# Patient Record
Sex: Male | Born: 1980 | Race: Black or African American | Hispanic: No | Marital: Single | Smoking: Current every day smoker
Health system: Southern US, Community
[De-identification: ages and names within clinical notes are randomized; demographics above are authoritative.]

## PROBLEM LIST (undated history)

## (undated) HISTORY — PX: TIBIA FRACTURE SURGERY: SHX806

---

## 2001-07-14 ENCOUNTER — Emergency Department (HOSPITAL_COMMUNITY): Admission: EM | Admit: 2001-07-14 | Discharge: 2001-07-14 | Payer: Self-pay | Admitting: Emergency Medicine

## 2001-10-29 ENCOUNTER — Emergency Department (HOSPITAL_COMMUNITY): Admission: EM | Admit: 2001-10-29 | Discharge: 2001-10-29 | Payer: Self-pay | Admitting: Emergency Medicine

## 2002-10-20 ENCOUNTER — Emergency Department (HOSPITAL_COMMUNITY): Admission: EM | Admit: 2002-10-20 | Discharge: 2002-10-20 | Payer: Self-pay | Admitting: Emergency Medicine

## 2002-10-23 ENCOUNTER — Emergency Department (HOSPITAL_COMMUNITY): Admission: EM | Admit: 2002-10-23 | Discharge: 2002-10-23 | Payer: Self-pay | Admitting: Emergency Medicine

## 2002-10-29 ENCOUNTER — Emergency Department (HOSPITAL_COMMUNITY): Admission: EM | Admit: 2002-10-29 | Discharge: 2002-10-29 | Payer: Self-pay | Admitting: Emergency Medicine

## 2004-12-12 ENCOUNTER — Emergency Department (HOSPITAL_COMMUNITY): Admission: EM | Admit: 2004-12-12 | Discharge: 2004-12-12 | Payer: Self-pay | Admitting: Emergency Medicine

## 2005-07-24 ENCOUNTER — Observation Stay (HOSPITAL_COMMUNITY): Admission: EM | Admit: 2005-07-24 | Discharge: 2005-07-25 | Payer: Self-pay | Admitting: Emergency Medicine

## 2005-08-28 ENCOUNTER — Emergency Department (HOSPITAL_COMMUNITY): Admission: EM | Admit: 2005-08-28 | Discharge: 2005-08-28 | Payer: Self-pay | Admitting: Emergency Medicine

## 2005-09-18 ENCOUNTER — Emergency Department (HOSPITAL_COMMUNITY): Admission: EM | Admit: 2005-09-18 | Discharge: 2005-09-18 | Payer: Self-pay | Admitting: Emergency Medicine

## 2007-08-19 ENCOUNTER — Emergency Department (HOSPITAL_COMMUNITY): Admission: EM | Admit: 2007-08-19 | Discharge: 2007-08-19 | Payer: Self-pay | Admitting: Emergency Medicine

## 2008-01-18 ENCOUNTER — Emergency Department (HOSPITAL_COMMUNITY): Admission: EM | Admit: 2008-01-18 | Discharge: 2008-01-18 | Payer: Self-pay | Admitting: Emergency Medicine

## 2009-02-15 ENCOUNTER — Emergency Department (HOSPITAL_COMMUNITY): Admission: EM | Admit: 2009-02-15 | Discharge: 2009-02-15 | Payer: Self-pay | Admitting: Emergency Medicine

## 2009-06-19 ENCOUNTER — Emergency Department (HOSPITAL_COMMUNITY): Admission: EM | Admit: 2009-06-19 | Discharge: 2009-06-19 | Payer: Self-pay | Admitting: Emergency Medicine
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
## 2015-10-11 ENCOUNTER — Emergency Department (HOSPITAL_COMMUNITY)
Admission: EM | Admit: 2015-10-11 | Discharge: 2015-10-12 | Disposition: A | Discharge status: Home / Self Care | Payer: Self-pay | Attending: Emergency Medicine | Admitting: Emergency Medicine

## 2015-10-11 ENCOUNTER — Encounter (HOSPITAL_COMMUNITY): Payer: Self-pay | Admitting: *Deleted

## 2015-10-11 ENCOUNTER — Emergency Department (HOSPITAL_COMMUNITY): Payer: Self-pay

## 2015-10-11 DIAGNOSIS — F17210 Nicotine dependence, cigarettes, uncomplicated: Secondary | ICD-10-CM | POA: Insufficient documentation

## 2015-10-11 DIAGNOSIS — Z4889 Encounter for other specified surgical aftercare: Secondary | ICD-10-CM

## 2015-10-11 DIAGNOSIS — Z4801 Encounter for change or removal of surgical wound dressing: Secondary | ICD-10-CM | POA: Insufficient documentation

## 2015-10-11 DIAGNOSIS — Z4802 Encounter for removal of sutures: Secondary | ICD-10-CM | POA: Insufficient documentation

## 2015-10-11 NOTE — ED Notes (Signed)
Pt states that he had surgery in [place] in 2/26; pt states that he broke the tibia and ankle; pt states that he has not followed up with anyone since the surgery; pt states that he needs to have the sutures removed and the wound checked; left leg is in a posterior splint

## 2015-10-12 NOTE — ED Notes (Signed)
MD at bedside. 

## 2015-10-12 NOTE — Discharge Instructions (Signed)
Follow up with Orthopedic doctor for further evaluation. Continue to not put any weight on the leg.  If you have severe pain, fevers, chills, numbness in the legs- come to the ER immediately.   Suture Removal, Care After Refer to this sheet in the next few weeks. These instructions provide you with information on caring for yourself after your procedure. Your health care provider may also give you more specific instructions. Your treatment has been planned according to current medical practices, but problems sometimes occur. Call your health care provider if you have any problems or questions after your procedure. WHAT TO EXPECT AFTER THE PROCEDURE After your stitches (sutures) are removed, it is typical to have the following:  Some discomfort and swelling in the wound area.  Slight redness in the area. HOME CARE INSTRUCTIONS   If you have skin adhesive strips over the wound area, do not take the strips off. They will fall off on their own in a few days. If the strips remain in place after 14 days, you may remove them.  Change any bandages (dressings) at least once a day or as directed by your health care provider. If the bandage sticks, soak it off with warm, soapy water.  Apply cream or ointment only as directed by your health care provider. If using cream or ointment, wash the area with soap and water 2 times a day to remove all the cream or ointment. Rinse off the soap and pat the area dry with a clean towel.  Keep the wound area dry and clean. If the bandage becomes wet or dirty, or if it develops a bad smell, change it as soon as possible.  Continue to protect the wound from injury.  Use sunscreen when out in the sun. New scars become sunburned easily. SEEK MEDICAL CARE IF:  You have increasing redness, swelling, or pain in the wound.  You see pus coming from the wound.  You have a fever.  You notice a bad smell coming from the wound or dressing.  Your wound breaks open  (edges not staying together).   This information is not intended to replace advice given to you by your health care provider. Make sure you discuss any questions you have with your health care provider.   Document Released: 04/04/2001 Document Revised: 04/30/2013 Document Reviewed: 02/19/2013 Elsevier Interactive Patient Education [employer]2016 Elsevier Inc.
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
## 2015-10-12 NOTE — ED Notes (Signed)
Ortho Tech at bedside.  

## 2015-10-12 NOTE — Progress Notes (Signed)
Orthopedic Tech Progress Note Patient Details:  Philip Watson 01/25/1981 [id number]  Ortho Devices Type of Ortho Device: Post (short leg) splint, Stirrup splint Ortho Device/Splint Location: lle Ortho Device/Splint Interventions: Ordered, Application As per dr verbal order splint was applied all the way to 2 fingers width from the bend of the knee and same length on stirrups.   Trinna PostMartinez, Khloey Chern J 10/12/2015, 3:04 AM

## 2016-09-29 ENCOUNTER — Emergency Department (HOSPITAL_COMMUNITY): Payer: Self-pay

## 2016-09-29 ENCOUNTER — Encounter (HOSPITAL_COMMUNITY): Payer: Self-pay | Admitting: Emergency Medicine

## 2016-09-29 ENCOUNTER — Emergency Department (HOSPITAL_COMMUNITY)
Admission: EM | Admit: 2016-09-29 | Discharge: 2016-09-29 | Disposition: A | Discharge status: Home / Self Care | Payer: Self-pay | Attending: Emergency Medicine | Admitting: Emergency Medicine

## 2016-09-29 DIAGNOSIS — R1031 Right lower quadrant pain: Secondary | ICD-10-CM | POA: Insufficient documentation

## 2016-09-29 DIAGNOSIS — F17210 Nicotine dependence, cigarettes, uncomplicated: Secondary | ICD-10-CM | POA: Insufficient documentation

## 2016-09-29 DIAGNOSIS — Z7982 Long term (current) use of aspirin: Secondary | ICD-10-CM | POA: Insufficient documentation

## 2016-09-29 DIAGNOSIS — Z79899 Other long term (current) drug therapy: Secondary | ICD-10-CM | POA: Insufficient documentation

## 2016-09-29 LAB — URINALYSIS, ROUTINE W REFLEX MICROSCOPIC
BILIRUBIN URINE: NEGATIVE
Bacteria, UA: NONE SEEN
Glucose, UA: NEGATIVE mg/dL
KETONES UR: NEGATIVE mg/dL
LEUKOCYTES UA: NEGATIVE
Nitrite: NEGATIVE
PH: 5 (ref 5.0–8.0)
PROTEIN: NEGATIVE mg/dL
SQUAMOUS EPITHELIAL / LPF: NONE SEEN
Specific Gravity, Urine: 1.015 (ref 1.005–1.030)

## 2016-09-29 LAB — CBC WITH DIFFERENTIAL/PLATELET
Basophils Absolute: 0 10*3/uL (ref 0.0–0.1)
Basophils Relative: 0 %
Eosinophils Absolute: 0.2 10*3/uL (ref 0.0–0.7)
Eosinophils Relative: 5 %
HCT: 42.6 % (ref 39.0–52.0)
Hemoglobin: 14 g/dL (ref 13.0–17.0)
Lymphocytes Relative: 53 %
Lymphs Abs: 2 10*3/uL (ref 0.7–4.0)
MCH: 29.7 pg (ref 26.0–34.0)
MCHC: 32.9 g/dL (ref 30.0–36.0)
MCV: 90.3 fL (ref 78.0–100.0)
Monocytes Absolute: 0.1 10*3/uL (ref 0.1–1.0)
Monocytes Relative: 1 %
Neutro Abs: 1.6 10*3/uL — ABNORMAL LOW (ref 1.7–7.7)
Neutrophils Relative %: 41 %
Platelets: 219 10*3/uL (ref 150–400)
RBC: 4.72 MIL/uL (ref 4.22–5.81)
RDW: 15.2 % (ref 11.5–15.5)
WBC: 3.8 10*3/uL — ABNORMAL LOW (ref 4.0–10.5)

## 2016-09-29 LAB — COMPREHENSIVE METABOLIC PANEL
ALBUMIN: 4.2 g/dL (ref 3.5–5.0)
ALT: 25 U/L (ref 17–63)
ANION GAP: 11 (ref 5–15)
AST: 33 U/L (ref 15–41)
Alkaline Phosphatase: 80 U/L (ref 38–126)
BUN: 5 mg/dL — AB (ref 6–20)
CHLORIDE: 105 mmol/L (ref 101–111)
CO2: 22 mmol/L (ref 22–32)
Calcium: 9.3 mg/dL (ref 8.9–10.3)
Creatinine, Ser: 0.73 mg/dL (ref 0.61–1.24)
GFR calc Af Amer: >60 mL/min (ref >60)
GFR calc non Af Amer: >60 mL/min (ref >60)
GLUCOSE: 87 mg/dL (ref 65–99)
POTASSIUM: 3.3 mmol/L — AB (ref 3.5–5.1)
SODIUM: 138 mmol/L (ref 135–145)
Total Bilirubin: 0.6 mg/dL (ref 0.3–1.2)
Total Protein: 7.7 g/dL (ref 6.5–8.1)

## 2016-09-29 LAB — LIPASE, BLOOD: LIPASE: 43 U/L (ref 11–51)

## 2016-09-29 LAB — POC OCCULT BLOOD, ED: Fecal Occult Bld: NEGATIVE

## 2016-09-29 MED ORDER — HYDROCODONE-ACETAMINOPHEN 5-325 MG PO TABS: 1.0000 | ORAL_TABLET | Freq: Four times a day (QID) | ORAL | 0 refills | Status: AC | PRN

## 2016-09-29 MED ORDER — IOPAMIDOL (ISOVUE-300) INJECTION 61%
INTRAVENOUS | Status: AC
  Administered 2016-09-29: 100 mL
  Filled 2016-09-29: qty 100

## 2016-09-29 MED ORDER — SODIUM CHLORIDE 0.9 % IV BOLUS (SEPSIS)
1000.0000 mL | Freq: Once | INTRAVENOUS | Status: AC
  Administered 2016-09-29: 1000 mL via INTRAVENOUS

## 2016-09-29 MED ORDER — FENTANYL CITRATE (PF) 100 MCG/2ML IJ SOLN
100.0000 ug | Freq: Once | INTRAMUSCULAR | Status: AC
  Administered 2016-09-29: 100 ug via INTRAVENOUS
  Filled 2016-09-29: qty 2

## 2016-09-29 MED ORDER — ONDANSETRON 4 MG PO TBDP
4.0000 mg | ORAL_TABLET | Freq: Once | ORAL | Status: AC | PRN
  Administered 2016-09-29: 4 mg via ORAL

## 2016-09-29 MED ORDER — ONDANSETRON 4 MG PO TBDP
ORAL_TABLET | ORAL | Status: AC
  Filled 2016-09-29: qty 1

## 2016-09-29 NOTE — ED Provider Notes (Signed)
[facility]EMERGENCY DEPT Provider Note   CSN: [id number] Arrival date & time: 09/29/16  0104  By signing my name below, I, Cynda Acres, attest that this documentation has been prepared under the direction and in the presence of [employer] Electronically Signed: Cynda Acres, Scribe. 09/29/16. 1:53 AM.  History   Chief Complaint Chief Complaint  Patient presents with  . Abdominal Pain  . Nausea  . Back Pain    HPI Comments: Philip Watson REINDERS is a 36 y.o. male with no pertinent medical history, who presents to the Emergency Department complaining ofright-sided abdominal pain that began yesterday. Patient states he was seen about a month and half ago at another facility and had laboratory testing and ultrasound performed with no significant abnormality.  Patient states that movement and palpation make the pain worse.  He states he did not take any medications prior to arrival.The patient denies chest pain, shortness of breath, headache,blurred vision, neck pain, fever, cough, weakness, numbness, dizziness, anorexia, edema, nausea, vomiting, diarrhea, rash, back pain, dysuria, hematemesis, bloody stool, near syncope, or syncope.  Patient reports associated nausea,   Patient denies any vomiting,   The history is provided by the patient. No language interpreter was used.    History reviewed. No pertinent past medical history.  There are no active problems to display for this patient.   Past Surgical History:  Procedure Laterality Date  . TIBIA FRACTURE SURGERY         Home Medications    Prior to Admission medications   Medication Sig Start Date End Date Taking? Authorizing Provider  aspirin 325 MG tablet Take 325 mg by mouth every 12 (twelve) hours as needed for mild pain, moderate pain or headache.    Historical Provider, MD  HYDROcodone-acetaminophen (NORCO/VICODIN) 5-325 MG tablet Take 1 tablet by mouth every 6 (six) hours as needed for moderate pain or severe pain.     Historical Provider, MD    Family History History reviewed. No pertinent family history.  Social History Social History  Substance Use Topics  . Smoking status: Current Every Day Smoker    Packs/day: 0.50    Types: Cigarettes  . Smokeless tobacco: Never Used  . Alcohol use Yes     Comment: socially     Allergies   Patient has no known allergies.   Review of Systems Review of Systems  Gastrointestinal: Positive for abdominal pain and nausea.  All other systems negative except as documented in the HPI. All pertinent positives and negatives as reviewed in the HPI.  Physical Exam Updated Vital Signs BP 141/97 (BP Location: Left Arm)   Pulse 72   Temp 98.1 F (36.7 C) (Oral)   Resp 18   SpO2 97%   Physical Exam  Constitutional: He is oriented to person, place, and time. He appears well-developed and well-nourished. No distress.  HENT:  Head: Normocephalic and atraumatic.  Mouth/Throat: Oropharynx is clear and moist.  Eyes: Pupils are equal, round, and reactive to light.  Neck: Normal range of motion. Neck supple.  Cardiovascular: Normal rate, regular rhythm and normal heart sounds.  Exam reveals no gallop and no friction rub.   No murmur heard. Pulmonary/Chest: Effort normal and breath sounds normal. No respiratory distress. He has no wheezes.  Abdominal: Soft. Bowel sounds are normal. He exhibits no distension and no mass. There is tenderness. There is no rebound and no guarding.    Neurological: He is alert and oriented to person, place, and time. He exhibits normal muscle  tone. Coordination normal.  Skin: Skin is warm and dry. Capillary refill takes less than 2 seconds. No rash noted. No erythema.  Psychiatric: He has a normal mood and affect. His behavior is normal.  Nursing note and vitals reviewed.    ED Treatments / Results  DIAGNOSTIC STUDIES: Oxygen Saturation is 97% on RA, normal by my interpretation.    COORDINATION OF CARE: 1:57 AM Discussed  treatment plan with pt at bedside and pt agreed to plan.  Labs (all labs ordered are listed, but only abnormal results are displayed) Labs Reviewed  LIPASE, BLOOD  COMPREHENSIVE METABOLIC PANEL  CBC  URINALYSIS, ROUTINE W REFLEX MICROSCOPIC    EKG  EKG Interpretation None       Radiology No results found.  Procedures Procedures (including critical care time)  Medications Ordered in ED Medications  ondansetron (ZOFRAN-ODT) 4 MG disintegrating tablet (not administered)  ondansetron (ZOFRAN-ODT) disintegrating tablet 4 mg (4 mg Oral Given 09/29/16 0147)     Initial Impression / Assessment and Plan / ED Course  I have reviewed the triage vital signs and the nursing notes.  Pertinent labs & imaging results that were available during my care of the patient were reviewed by me and considered in my medical decision making (see chart for details).     Patient has a negative CT scan and laboratory testing, and advised him that we will need to have him follow-up with GI for further evaluation.  Patient agrees the plan and all questions were answered this point, no specific identifiable cause for the abdominal pain.  Patient is a fairly heavy drinker.   Final Clinical Impressions(s) / ED Diagnoses   Final diagnoses:  None    New Prescriptions New Prescriptions   No medications on file   I personally performed the services described in this documentation, which was scribed in my presence. The recorded information has been reviewed and is accurate.     Charlestine NightChristopher Uchechi [place], PA-C 09/29/16 0533    Gilda Creasehristopher J Pollina, MD 09/29/16 [phone]

## 2016-09-29 NOTE — Discharge Instructions (Signed)
Return here as needed.  Follow-up with the, Dr. provided °

## 2016-09-29 NOTE — ED Notes (Signed)
Sent add on label to main lab. 

## 2016-09-29 NOTE — ED Triage Notes (Signed)
Pt presents with R sided abd pain that began today with walking; pt states the pain extends from RUQ to RLQ and around to the lower back; pt also reporting nausea without vomiting

## 2018-11-04 IMAGING — CT CT ABD-PELV W/ CM
2 of 4 series · 16 of 46 positions shown, 18 images · IV contrast (iopamidol)
Comparison: None.

CLINICAL DATA: Right-sided abdominal pain radiating to the back,
onset yesterday. Nausea.

EXAM:
CT ABDOMEN AND PELVIS WITH CONTRAST
TECHNIQUE: Multidetector CT imaging of the abdomen and pelvis was performed
using the standard protocol following bolus administration of
intravenous contrast.
CONTRAST:  100mL [id number] IOPAMIDOL (99DXMW-A11) INJECTION 61%

[Series 3: a/p w/ 5mm · axial · 0.59mm/px · z∈[+696,+1136]mm · 13 of 96 slices shown, 15 images]
[im 4/96  soft-tissue]
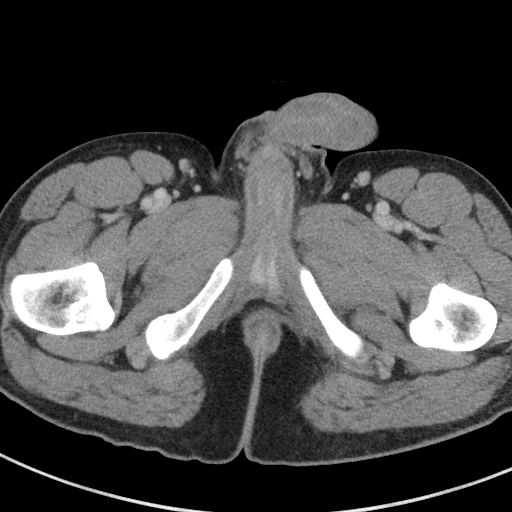
[im 4/96  bone]
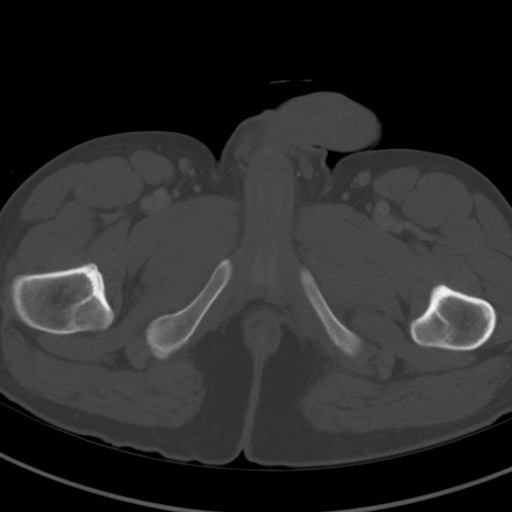
[im 12/96  soft-tissue]
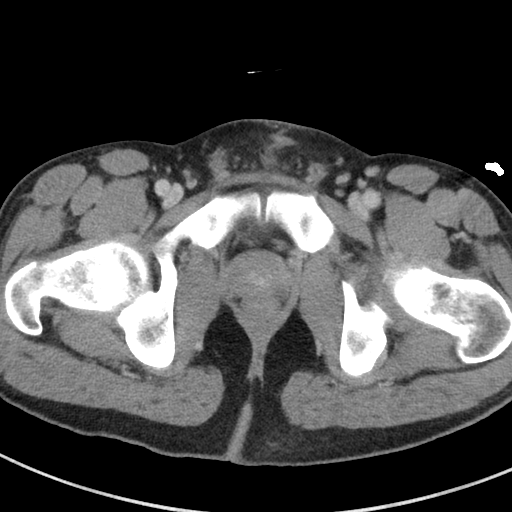
[im 20/96  soft-tissue]
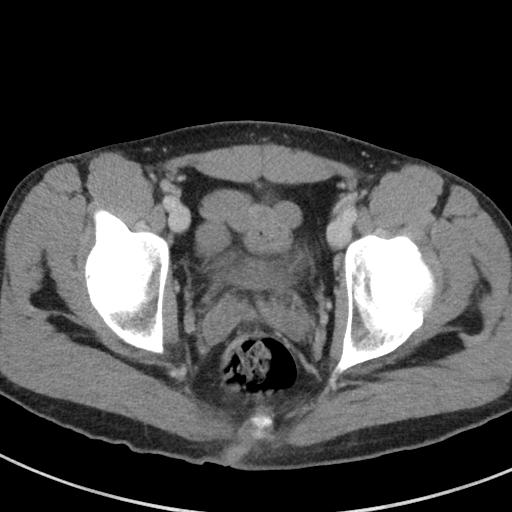
[im 27/96  soft-tissue]
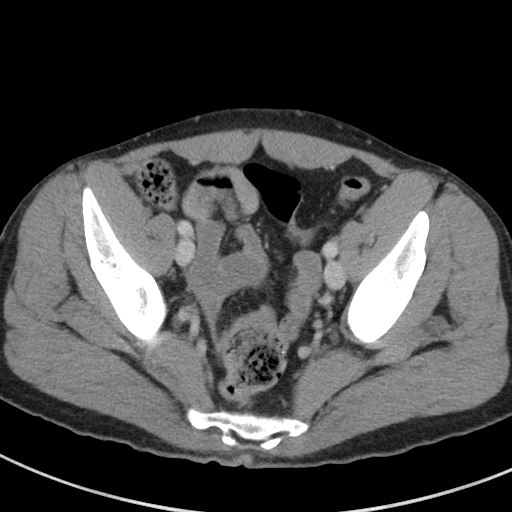
[im 35/96  soft-tissue]
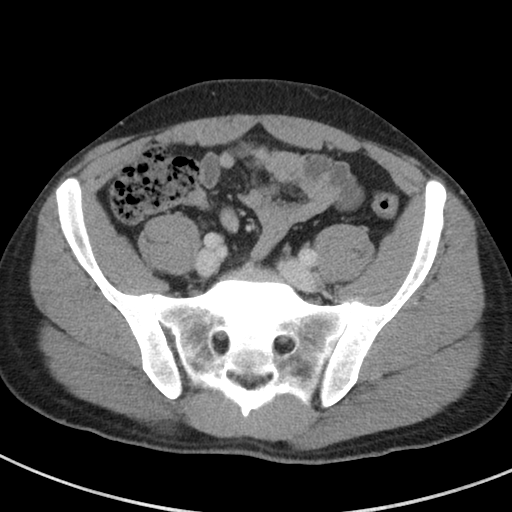
[im 42/96  soft-tissue]
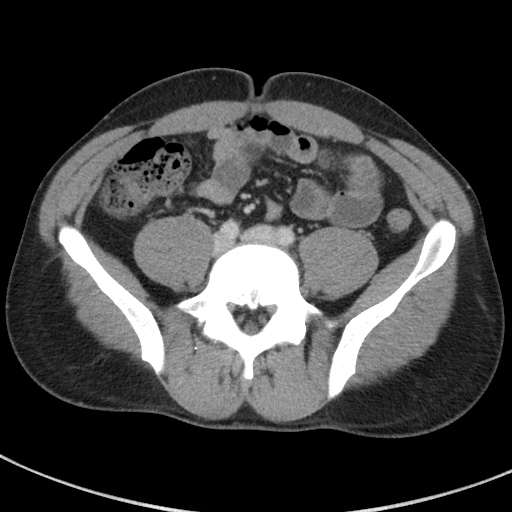
[im 50/96  soft-tissue]
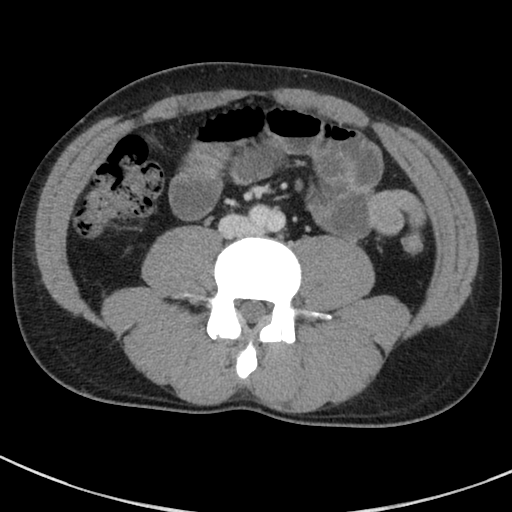
[im 54/96  soft-tissue]
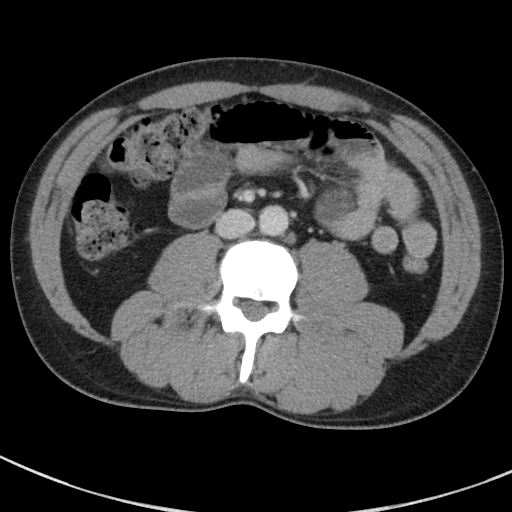
[im 61/96  soft-tissue]
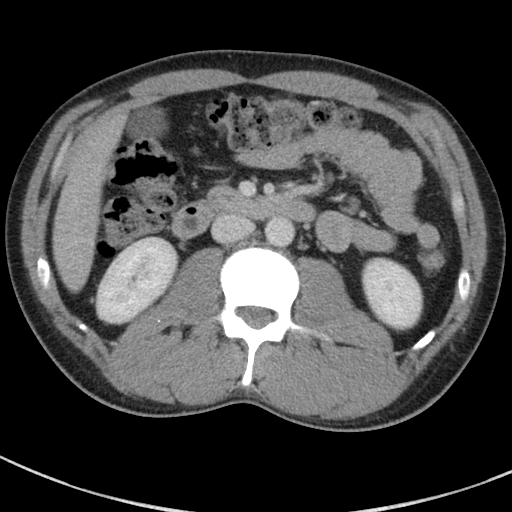
[im 61/96  bone]
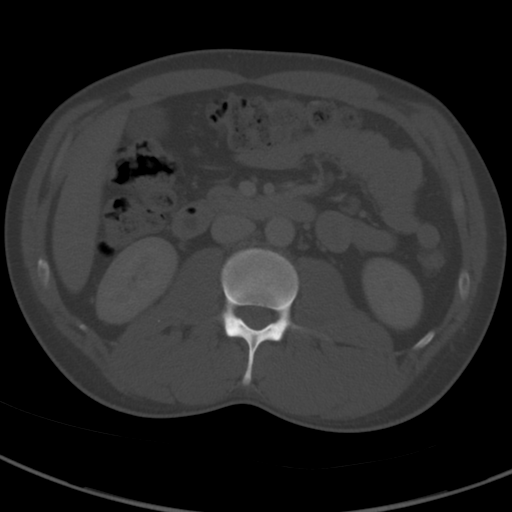
[im 69/96  soft-tissue]
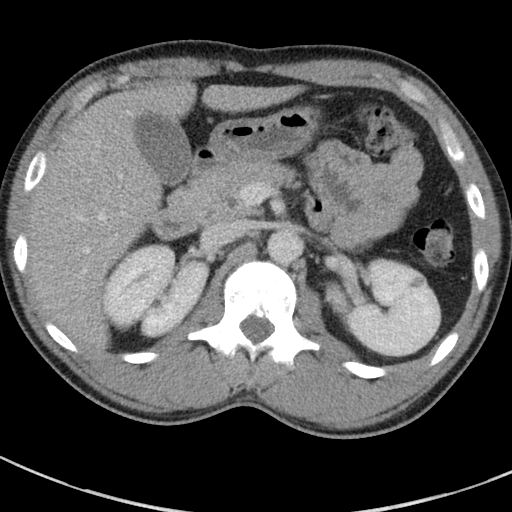
[im 77/96  soft-tissue]
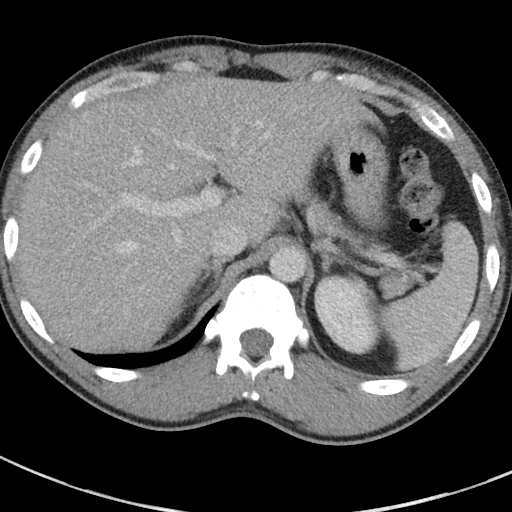
[im 84/96  soft-tissue]
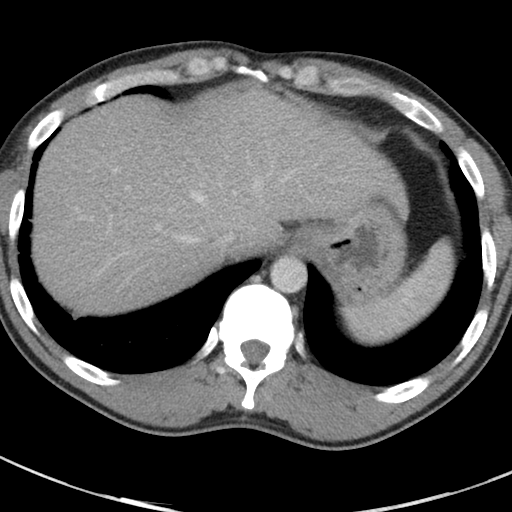
[im 92/96  soft-tissue]
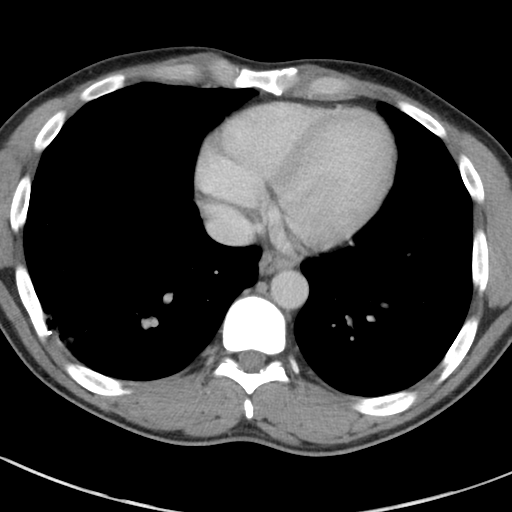

[Series 6: a/p w/ cor · coronal · 0.62mm/px · 3 of 111 slices shown]
[im 37/111  soft-tissue]
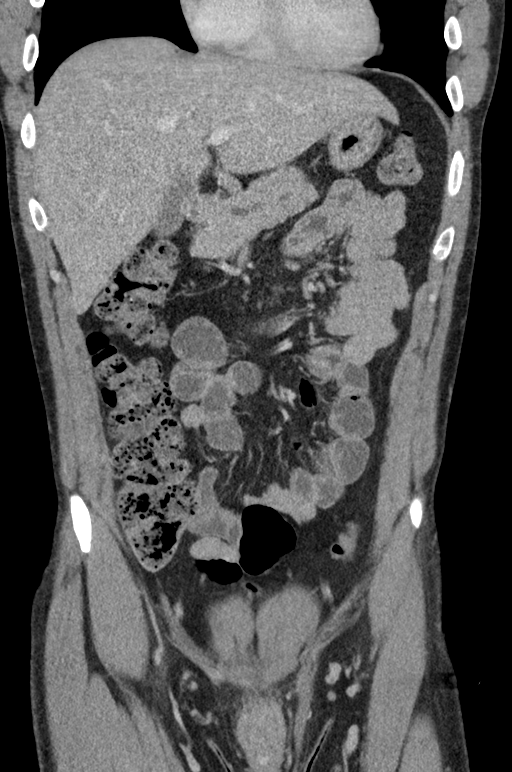
[im 49/111  soft-tissue]
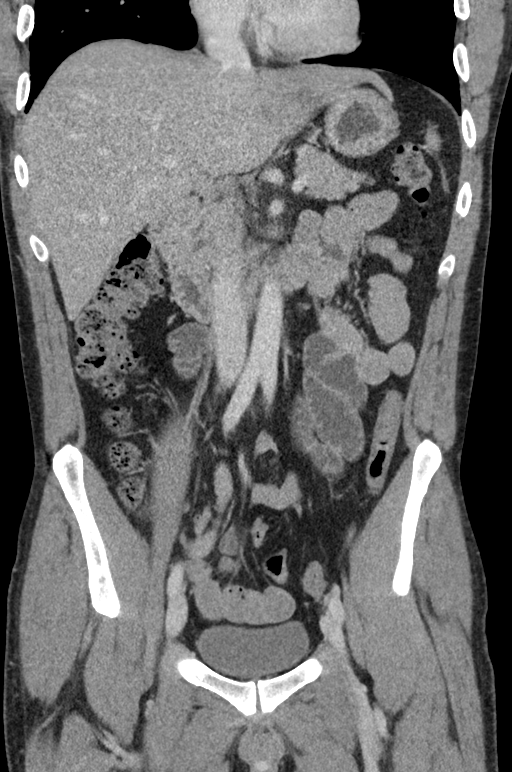
[im 62/111  soft-tissue]
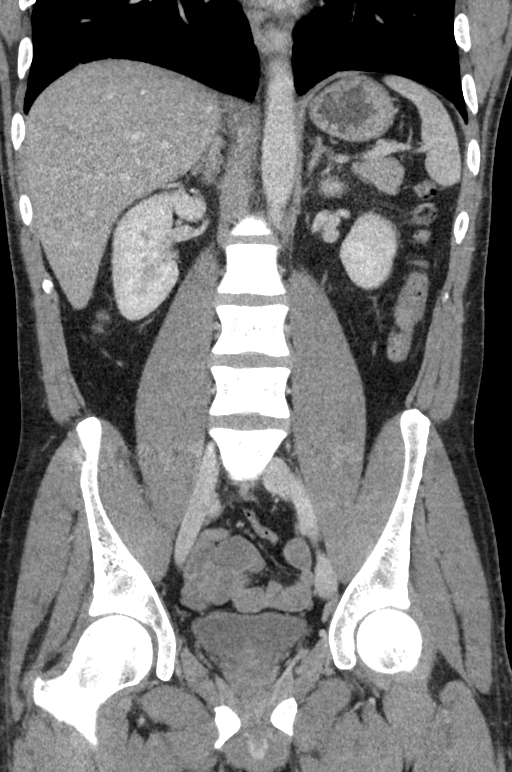

[16 of 46 positions shown; findings below may reference images not displayed]

FINDINGS: Lower chest: Mild linear scarring or atelectasis in the right base.
No consolidation. No effusion.

Hepatobiliary: No focal liver abnormality is seen. No gallstones,
gallbladder wall thickening, or biliary dilatation.

Pancreas: Unremarkable. No pancreatic ductal dilatation or
surrounding inflammatory changes.

Spleen: Normal in size without focal abnormality.

Adrenals/Urinary Tract: Adrenal glands are unremarkable. Focal
scarring at the upper pole of the right kidney. Kidneys are
otherwise normal, without renal calculi, focal lesion, or
hydronephrosis. Bladder is unremarkable.

Stomach/Bowel: Stomach is within normal limits. Appendix is normal.
No evidence of bowel wall thickening, distention, or inflammatory
changes.

Vascular/Lymphatic: No significant vascular findings are present. No
enlarged abdominal or pelvic lymph nodes.

Reproductive: Unremarkable

Other: No focal inflammation.  No ascites.

Musculoskeletal: No significant skeletal lesion. Metal fragment in
the right posterior iliac crest.
IMPRESSION: No significant abnormality
